# Patient Record
Sex: Male | Born: 1990 | Race: White | Hispanic: No | Marital: Single | Smoking: Current every day smoker
Health system: Southern US, Community
[De-identification: ages and names within clinical notes are randomized; demographics above are authoritative.]

---
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
## 2009-03-02 ENCOUNTER — Emergency Department (HOSPITAL_BASED_OUTPATIENT_CLINIC_OR_DEPARTMENT_OTHER): Admission: EM | Admit: 2009-03-02 | Discharge: 2009-03-03 | Payer: Self-pay | Admitting: Emergency Medicine

## 2010-12-19 ENCOUNTER — Emergency Department (HOSPITAL_BASED_OUTPATIENT_CLINIC_OR_DEPARTMENT_OTHER)
Admission: EM | Admit: 2010-12-19 | Discharge: 2010-12-19 | Disposition: A | Discharge status: Home / Self Care | Payer: Self-pay | Attending: Emergency Medicine | Admitting: Emergency Medicine

## 2010-12-19 DIAGNOSIS — F17200 Nicotine dependence, unspecified, uncomplicated: Secondary | ICD-10-CM | POA: Insufficient documentation

## 2010-12-19 DIAGNOSIS — J45909 Unspecified asthma, uncomplicated: Secondary | ICD-10-CM | POA: Insufficient documentation

## 2010-12-19 DIAGNOSIS — L0501 Pilonidal cyst with abscess: Secondary | ICD-10-CM | POA: Insufficient documentation

## 2011-01-10 ENCOUNTER — Emergency Department (HOSPITAL_BASED_OUTPATIENT_CLINIC_OR_DEPARTMENT_OTHER)
Admission: EM | Admit: 2011-01-10 | Discharge: 2011-01-10 | Disposition: A | Discharge status: Home / Self Care | Payer: Self-pay | Attending: Emergency Medicine | Admitting: Emergency Medicine

## 2011-01-10 ENCOUNTER — Emergency Department (INDEPENDENT_AMBULATORY_CARE_PROVIDER_SITE_OTHER): Payer: No Typology Code available for payment source

## 2011-01-10 DIAGNOSIS — F909 Attention-deficit hyperactivity disorder, unspecified type: Secondary | ICD-10-CM | POA: Insufficient documentation

## 2011-01-10 DIAGNOSIS — J45909 Unspecified asthma, uncomplicated: Secondary | ICD-10-CM | POA: Insufficient documentation

## 2011-01-10 DIAGNOSIS — S62329A Displaced fracture of shaft of unspecified metacarpal bone, initial encounter for closed fracture: Secondary | ICD-10-CM | POA: Insufficient documentation

## 2011-01-10 DIAGNOSIS — Y92009 Unspecified place in unspecified non-institutional (private) residence as the place of occurrence of the external cause: Secondary | ICD-10-CM | POA: Insufficient documentation

## 2011-01-10 DIAGNOSIS — W2209XA Striking against other stationary object, initial encounter: Secondary | ICD-10-CM

## 2011-01-10 DIAGNOSIS — X838XXA Intentional self-harm by other specified means, initial encounter: Secondary | ICD-10-CM | POA: Insufficient documentation

## 2011-01-28 ENCOUNTER — Encounter (HOSPITAL_COMMUNITY)
Admission: RE | Admit: 2011-01-28 | Discharge: 2011-01-28 | Disposition: A | Discharge status: Home / Self Care | Payer: Self-pay | Source: Ambulatory Visit | Attending: Orthopedic Surgery | Admitting: Orthopedic Surgery

## 2011-01-28 LAB — DIFFERENTIAL
Basophils Absolute: 0 10*3/uL (ref 0.0–0.1)
Basophils Relative: 0 % (ref 0–1)
Eosinophils Absolute: 0.4 10*3/uL (ref 0.0–0.7)
Eosinophils Relative: 4 % (ref 0–5)
Lymphocytes Relative: 30 % (ref 12–46)
Lymphs Abs: 2.7 10*3/uL (ref 0.7–4.0)
Monocytes Absolute: 0.8 10*3/uL (ref 0.1–1.0)
Monocytes Relative: 9 % (ref 3–12)
Neutro Abs: 5.2 10*3/uL (ref 1.7–7.7)
Neutrophils Relative %: 57 % (ref 43–77)

## 2011-01-28 LAB — CBC
HCT: 48.1 % (ref 39.0–52.0)
Hemoglobin: 17.8 g/dL — ABNORMAL HIGH (ref 13.0–17.0)
MCH: 31.7 pg (ref 26.0–34.0)
MCHC: 37 g/dL — ABNORMAL HIGH (ref 30.0–36.0)
MCV: 85.6 fL (ref 78.0–100.0)
Platelets: 166 10*3/uL (ref 150–400)
RBC: 5.62 MIL/uL (ref 4.22–5.81)
RDW: 12.4 % (ref 11.5–15.5)
WBC: 9.1 10*3/uL (ref 4.0–10.5)

## 2011-01-28 LAB — SURGICAL PCR SCREEN
MRSA, PCR: POSITIVE — AB
Staphylococcus aureus: POSITIVE — AB

## 2011-01-29 ENCOUNTER — Ambulatory Visit (HOSPITAL_COMMUNITY)
Admission: RE | Admit: 2011-01-29 | Discharge: 2011-01-29 | Disposition: A | Discharge status: Home / Self Care | Payer: Self-pay | Source: Ambulatory Visit | Attending: Orthopedic Surgery | Admitting: Orthopedic Surgery

## 2011-01-29 DIAGNOSIS — X838XXA Intentional self-harm by other specified means, initial encounter: Secondary | ICD-10-CM | POA: Insufficient documentation

## 2011-01-29 DIAGNOSIS — Y92009 Unspecified place in unspecified non-institutional (private) residence as the place of occurrence of the external cause: Secondary | ICD-10-CM | POA: Insufficient documentation

## 2011-01-29 DIAGNOSIS — S62309A Unspecified fracture of unspecified metacarpal bone, initial encounter for closed fracture: Secondary | ICD-10-CM | POA: Insufficient documentation

## 2011-01-29 DIAGNOSIS — Z01810 Encounter for preprocedural cardiovascular examination: Secondary | ICD-10-CM | POA: Insufficient documentation

## 2011-02-07 ENCOUNTER — Ambulatory Visit: Payer: No Typology Code available for payment source | Attending: Orthopedic Surgery | Admitting: *Deleted

## 2011-02-07 DIAGNOSIS — M25569 Pain in unspecified knee: Secondary | ICD-10-CM | POA: Insufficient documentation

## 2011-02-07 DIAGNOSIS — IMO0001 Reserved for inherently not codable concepts without codable children: Secondary | ICD-10-CM | POA: Insufficient documentation

## 2011-02-07 NOTE — Op Note (Signed)
  NAMECRANDALL, Antonio              ACCOUNT NO.:  [id number]  MEDICAL RECORD NO.:  [id number]           PATIENT TYPE:  LOCATION:                                 FACILITY:  PHYSICIAN:  Artist Pais. Elaf Clauson, M.D.DATE OF BIRTH:  04/17/1991  DATE OF PROCEDURE:  01/29/2011 DATE OF DISCHARGE:                              OPERATIVE REPORT   PREOPERATIVE DIAGNOSIS:  Displaced right small finger metacarpal fracture.  POSTOPERATIVE DIAGNOSIS:  Displaced right small finger metacarpal fracture.  PROCEDURE:  Open reduction and internal fixation of above using 1.6-mm intramedullary rod.  SURGEON:  Artist Pais. Mina Marble, MD  ASSISTANT:  None.  ANESTHESIA:  General.  TOURNIQUET TIME:  18 minutes.  COMPLICATIONS:  None.  DRAINS:  None.  The patient was taken to the operating suite.  After induction of adequate general anesthesia, right upper extremity was prepped and draped in sterile fashion.  An Esmarch was used to exsanguinate the limb.  Tourniquet was then inflated to 250 mmHg.  At this point in time, fluoroscopy was used to identify the intersection between the metaphyseal and diaphyseal flair of the small finger metacarpal on the right hand.  Once this was done, the skin was incised in that area. Dissection was carried down to the shaft of the metacarpal, and at this point in time, the 1.6-mm IM rod was introduced into the intramedullary canal of the small finger metacarpal on right side.  Using a Jahss maneuver, the metacarpal was reduced with downward pressure on the shaft and pressure on the MP joint with the PIP joint flexed to 90 degrees. Once this was done, under fluoroscopic guidance, IM rod was passed across the fracture site and then placed into stable position. Intraoperative fluoroscopy revealed adequate reduction on AP, lateral, and oblique views.  The IM rod was cut below the skin bent to 90 degrees.  The wound was irrigated and loosely closed with 4-0 Vicryl Rapide  suture.  Xeroform, 4x4s, and a compressive wrap was applied as well as an ulnar gutter splint.  The patient tolerated the procedure well and went to recovery room in stable fashion.     Artist Pais Mina Marble, M.D.     MAW/MEDQ  D:  01/29/2011  T:  01/29/2011  Job:  [id number]  Electronically Signed by Dairl Ponder M.D. on 02/07/2011 03:23:15 PM

## 2019-04-09 ENCOUNTER — Emergency Department (HOSPITAL_COMMUNITY): Payer: Self-pay

## 2019-04-09 ENCOUNTER — Other Ambulatory Visit: Payer: Self-pay

## 2019-04-09 ENCOUNTER — Encounter (HOSPITAL_COMMUNITY): Payer: Self-pay | Admitting: Emergency Medicine

## 2019-04-09 ENCOUNTER — Emergency Department (HOSPITAL_COMMUNITY)
Admission: EM | Admit: 2019-04-09 | Discharge: 2019-04-09 | Disposition: A | Discharge status: Home / Self Care | Payer: Self-pay | Attending: Emergency Medicine | Admitting: Emergency Medicine

## 2019-04-09 DIAGNOSIS — R112 Nausea with vomiting, unspecified: Secondary | ICD-10-CM | POA: Insufficient documentation

## 2019-04-09 DIAGNOSIS — R531 Weakness: Secondary | ICD-10-CM | POA: Insufficient documentation

## 2019-04-09 DIAGNOSIS — N200 Calculus of kidney: Secondary | ICD-10-CM

## 2019-04-09 DIAGNOSIS — N130 Hydronephrosis with ureteropelvic junction obstruction: Secondary | ICD-10-CM | POA: Insufficient documentation

## 2019-04-09 DIAGNOSIS — N134 Hydroureter: Secondary | ICD-10-CM | POA: Insufficient documentation

## 2019-04-09 DIAGNOSIS — F17210 Nicotine dependence, cigarettes, uncomplicated: Secondary | ICD-10-CM | POA: Insufficient documentation

## 2019-04-09 DIAGNOSIS — N50811 Right testicular pain: Secondary | ICD-10-CM | POA: Insufficient documentation

## 2019-04-09 DIAGNOSIS — R10815 Periumbilic abdominal tenderness: Secondary | ICD-10-CM | POA: Insufficient documentation

## 2019-04-09 DIAGNOSIS — N201 Calculus of ureter: Secondary | ICD-10-CM | POA: Insufficient documentation

## 2019-04-09 LAB — BASIC METABOLIC PANEL
Anion gap: 13 (ref 5–15)
BUN: 10 mg/dL (ref 6–20)
CO2: 22 mmol/L (ref 22–32)
Calcium: 9.4 mg/dL (ref 8.9–10.3)
Chloride: 103 mmol/L (ref 98–111)
Creatinine, Ser: 1.28 mg/dL — ABNORMAL HIGH (ref 0.61–1.24)
GFR calc Af Amer: >60 mL/min (ref >60)
GFR calc non Af Amer: >60 mL/min (ref >60)
Glucose, Bld: 112 mg/dL — ABNORMAL HIGH (ref 70–99)
Potassium: 3.6 mmol/L (ref 3.5–5.1)
Sodium: 138 mmol/L (ref 135–145)

## 2019-04-09 LAB — URINALYSIS, MICROSCOPIC (REFLEX)

## 2019-04-09 LAB — CBC
HCT: 50 % (ref 39.0–52.0)
Hemoglobin: 17.7 g/dL — ABNORMAL HIGH (ref 13.0–17.0)
MCH: 31.6 pg (ref 26.0–34.0)
MCHC: 35.4 g/dL (ref 30.0–36.0)
MCV: 89.1 fL (ref 80.0–100.0)
Platelets: 257 10*3/uL (ref 150–400)
RBC: 5.61 MIL/uL (ref 4.22–5.81)
RDW: 12.2 % (ref 11.5–15.5)
WBC: 14 10*3/uL — ABNORMAL HIGH (ref 4.0–10.5)
nRBC: 0 % (ref 0.0–0.2)

## 2019-04-09 LAB — URINALYSIS, ROUTINE W REFLEX MICROSCOPIC
Glucose, UA: 100 mg/dL — AB
Ketones, ur: 15 mg/dL — AB
Nitrite: NEGATIVE
Protein, ur: 100 mg/dL — AB
Specific Gravity, Urine: >1.03 — ABNORMAL HIGH (ref 1.005–1.030)
pH: 6 (ref 5.0–8.0)

## 2019-04-09 MED ORDER — KETOROLAC TROMETHAMINE 15 MG/ML IJ SOLN
15.0000 mg | Freq: Once | INTRAMUSCULAR | Status: AC
  Administered 2019-04-09: 15 mg via INTRAVENOUS
  Filled 2019-04-09: qty 1

## 2019-04-09 MED ORDER — HYDROCODONE-ACETAMINOPHEN 5-325 MG PO TABS
1.0000 | ORAL_TABLET | Freq: Four times a day (QID) | ORAL | Status: DC | PRN
  Administered 2019-04-09: 1 via ORAL
  Filled 2019-04-09: qty 1

## 2019-04-09 NOTE — ED Notes (Signed)
Pt began vomiting while in triage

## 2019-04-09 NOTE — ED Triage Notes (Signed)
Pt states he started having right flank pain and right testicle pain approx 2 hours ago. Pt states he has had some difficulty urinating as well. Pt diaphoretic stating pain 10/10. Denies testicle swelling or injury.

## 2019-04-09 NOTE — Discharge Instructions (Addendum)
You were seen in the Emergency Room today for abdominal pain caused by a kidney stone. The stone was seen on CT imaging and is small enough to be passed at home. Continue pain medication and hydration at home to facilitate passage of the stone.  Please return to the Emergency Room if you begin to have fevers/chills, vomiting, worsening abdominal pain, or are unable to urinate.

## 2019-04-09 NOTE — ED Provider Notes (Signed)
[facility] EMERGENCY DEPARTMENT Provider Note   CSN: [id number] Arrival date & time: 04/09/19  1656    History   Chief Complaint Chief Complaint  Patient presents with  . Testicle Pain  . Flank Pain    HPI Antonio Choi is a 28 y.o. male with no significant PMH who presented to the ED with acute onset flank pain that is now radiating to his testicle. The pain began about 2 hours ago and is worsening. Pt describes the pain as stabbing and occasionally burning. The pain waxes and wanes between an 8 and 10 out of 10. He states the pain is minimally relieved when he is laying down, but he can't discern what makes it worse. Pt endorses difficulty urinating, dysuria, urgency, vomiting, diaphoresis, and generalized weakness. He states his last bowel movement was last night. Pt denies ever passing a kidney stone.      Flank Pain This is a new problem. The current episode started 1 to 2 hours ago. The problem has been gradually worsening. Associated symptoms include abdominal pain. Pertinent negatives include no chest pain, no headaches and no shortness of breath. The symptoms are relieved by lying down. He has tried nothing for the symptoms.    History reviewed. No pertinent past medical history.  There are no active problems to display for this patient.   History reviewed. No pertinent surgical history.    Home Medications    Prior to Admission medications   Medication Sig Start Date End Date Taking? Authorizing Provider  acetaminophen (TYLENOL) 325 MG tablet Take 650 mg by mouth every 6 (six) hours as needed for mild pain.   Yes [provider]    Family History History reviewed. No pertinent family history.  Social History Social History   Tobacco Use  . Smoking status: Current Every Day Smoker    Types: Cigarettes  . Smokeless tobacco: Never Used  Substance Use Topics  . Alcohol use: Yes  . Drug use: Never     Allergies   Patient has  no known allergies.   Review of Systems Review of Systems  Constitutional: Positive for diaphoresis. Negative for chills and fever.  HENT: Negative for congestion, rhinorrhea and sore throat.   Respiratory: Negative for cough and shortness of breath.   Cardiovascular: Negative for chest pain and palpitations.  Gastrointestinal: Positive for abdominal pain, constipation, nausea and vomiting. Negative for diarrhea.  Genitourinary: Positive for difficulty urinating, dysuria, flank pain, frequency and testicular pain. Negative for discharge, hematuria, penile pain, penile swelling, scrotal swelling and urgency.  Neurological: Positive for weakness. Negative for dizziness, syncope, light-headedness and headaches.     Physical Exam Updated Vital Signs BP (!) 141/84   Pulse 96   Temp 97.9 F (36.6 C) (Oral)   Resp 17   Ht 5\' 5"  (1.651 m)   Wt 90.7 kg   SpO2 99%   BMI 33.28 kg/m   Physical Exam Vitals signs and nursing note reviewed.  Constitutional:      Appearance: He is well-developed. He is diaphoretic.  HENT:     Head: Normocephalic and atraumatic.     Mouth/Throat:     Mouth: Mucous membranes are moist.  Eyes:     Extraocular Movements: Extraocular movements intact.     Conjunctiva/sclera: Conjunctivae normal.  Neck:     Musculoskeletal: Neck supple.  Cardiovascular:     Rate and Rhythm: Normal rate and regular rhythm.     Heart sounds: No murmur.  Pulmonary:  Effort: Pulmonary effort is normal. No respiratory distress.     Breath sounds: Normal breath sounds.  Abdominal:     General: Bowel sounds are normal.     Palpations: Abdomen is soft.     Tenderness: There is abdominal tenderness in the right lower quadrant, periumbilical area and suprapubic area. There is right CVA tenderness and rebound. There is no left CVA tenderness or guarding. Positive signs include McBurney's sign and psoas sign.     Hernia: No hernia is present.  Genitourinary:    Penis: Normal.       Scrotum/Testes: Cremasteric reflex is present.        Right: Tenderness present. Swelling not present.        Left: Tenderness or swelling not present.  Musculoskeletal:        General: No swelling.  Skin:    General: Skin is warm.  Neurological:     General: No focal deficit present.     Mental Status: He is alert.      ED Treatments / Results  Labs (all labs ordered are listed, but only abnormal results are displayed) Labs Reviewed  URINALYSIS, ROUTINE W REFLEX MICROSCOPIC - Abnormal; Notable for the following components:      Result Value   Specific Gravity, Urine >1.030 (*)    Glucose, UA 100 (*)    Hgb urine dipstick LARGE (*)    Bilirubin Urine SMALL (*)    Ketones, ur 15 (*)    Protein, ur 100 (*)    Leukocytes,Ua TRACE (*)    All other components within normal limits  BASIC METABOLIC PANEL - Abnormal; Notable for the following components:   Glucose, Bld 112 (*)    Creatinine, Ser 1.28 (*)    All other components within normal limits  CBC - Abnormal; Notable for the following components:   WBC 14.0 (*)    Hemoglobin 17.7 (*)    All other components within normal limits  URINALYSIS, MICROSCOPIC (REFLEX) - Abnormal; Notable for the following components:   Bacteria, UA RARE (*)    All other components within normal limits    EKG None  Radiology Ct Renal Stone Study  Result Date: 04/09/2019 CLINICAL DATA:  Right flank pain. EXAM: CT ABDOMEN AND PELVIS WITHOUT CONTRAST TECHNIQUE: Multidetector CT imaging of the abdomen and pelvis was performed following the standard protocol without IV contrast. COMPARISON:  None. FINDINGS: Lower chest: The lung bases are clear. Hepatobiliary: Borderline hepatic steatosis. No focal hepatic abnormality. Gallbladder physiologically distended, no calcified stone. No biliary dilatation. Pancreas: No ductal dilatation or inflammation. Spleen: Normal in size without focal abnormality. Splenule inferiorly. Adrenals/Urinary Tract: Normal  adrenal glands. Punctate obstructing stone at the right ureterovesicular junction with mild hydroureteronephrosis, stone best appreciated on coronal reformat image 57 series 6. No definite additional nonobstructing stones in either kidney. No left hydronephrosis. Urinary bladder near completely empty. Stomach/Bowel: Stomach is within normal limits. Appendix appears normal. No evidence of bowel wall thickening, distention, or inflammatory changes. Vascular/Lymphatic: Abdominal aorta is normal in caliber. Retroaortic left renal vein. No enlarged lymph nodes in the abdomen or pelvis. Reproductive: Prostate is unremarkable. Other: No free air, free fluid, or intra-abdominal fluid collection. Musculoskeletal: There are no acute or suspicious osseous abnormalities. None fusion posterior elements of S1. IMPRESSION: Punctate obstructing stone at the right ureterovesicular junction with mild hydroureteronephrosis. Electronically Signed   By: Narda RutherfordMelanie  Sanford M.D.   On: 04/09/2019 19:35    Procedures Procedures (including critical care time)  Medications Ordered  in ED Medications  ketorolac (TORADOL) 15 MG/ML injection 15 mg (15 mg Intravenous Given 04/09/19 1811)     Initial Impression / Assessment and Plan / ED Course  I have reviewed the triage vital signs and the nursing notes.  Pertinent labs & imaging results that were available during my care of the patient were reviewed by me and considered in my medical decision making (see chart for details).    Antonio Choi is a 28 y.o. male with no significant PMH who presented to the ED with acute onset flank pain that is now radiating to his testicle concerning for nephrolithiasis. CT revealed punctate obstructing stone at the right ureterovesicular junction with mild hydroureteronephrosis. Pt had leukocytosis of 14, creatinine was slightly elevated to 1.28, and UA showed large Hgb, rare bacteria, and 6-10 WBCs. His pain was well controlled in the ED with a  one time dose of Toradol 15mg  IV. Pt is clinically stable and able to pass the stone at home with continued hydration and pain control.    Final Clinical Impressions(s) / ED Diagnoses   Final diagnoses:  Kidney stone    ED Discharge Orders    None       Thom ChimesJones, Nathifa Ritthaler, MD 04/09/19 2202    Gwyneth SproutPlunkett, Whitney, MD 04/10/19 [phone]

## 2019-04-09 NOTE — ED Notes (Signed)
Pt discharged from ED; instructions provided and scripts; Pt encouraged to return to ED if symptoms worsen and to f/u with PCP; Pt verbalized understanding of all instructions 

## 2020-08-17 ENCOUNTER — Other Ambulatory Visit: Payer: Self-pay

## 2020-08-17 DIAGNOSIS — Z20822 Contact with and (suspected) exposure to covid-19: Secondary | ICD-10-CM

## 2020-08-19 LAB — NOVEL CORONAVIRUS, NAA: SARS-CoV-2, NAA: DETECTED — AB

## 2020-08-19 LAB — SARS-COV-2, NAA 2 DAY TAT

## 2020-08-20 ENCOUNTER — Telehealth (HOSPITAL_COMMUNITY): Payer: Self-pay | Admitting: Family

## 2020-08-20 DIAGNOSIS — U071 COVID-19: Secondary | ICD-10-CM

## 2020-08-20 NOTE — Telephone Encounter (Signed)
Called to discuss with Antonio Choi about Covid symptoms and the use of casirivimab/imdevimab, a combination monoclonal antibody infusion for those with mild to moderate Covid symptoms and at a high risk of hospitalization.     Pt is qualified for this infusion at the infusion center due to co-morbid conditions and/or a member of an at-risk group, however declines infusion at this time. Symptoms tier reviewed as well as criteria for ending isolation.  Symptoms reviewed that would warrant ED/Hospital evaluation. Preventative practices reviewed. Patient verbalized understanding. Patient advised to call back if he decides that he does want to get infusion. Callback number to the infusion center given. Patient advised to go to Urgent care or ED with severe symptoms.  There are no problems to display for this patient.   Antonio Toppins,NP

## 2020-08-28 ENCOUNTER — Telehealth: Payer: Self-pay

## 2020-08-28 NOTE — Telephone Encounter (Signed)
Pt. Calling to verify positive COVID 19 test. States employer told him it was negative, but it shows a positive result. Verbalizes understanding.

## 2021-02-10 IMAGING — CT CT RENAL STONE PROTOCOL
2 of 4 series · 17 of 46 positions shown, 19 images · non-contrast
Comparison: None.

CLINICAL DATA: Right flank pain.

EXAM:
CT ABDOMEN AND PELVIS WITHOUT CONTRAST
TECHNIQUE: Multidetector CT imaging of the abdomen and pelvis was performed
following the standard protocol without IV contrast.

[Series 3: stone study 5.0 i30f 2 · axial · 0.82mm/px · z∈[+629,+1064]mm · 14 of 97 slices shown, 16 images]
[im 5/97  soft-tissue]
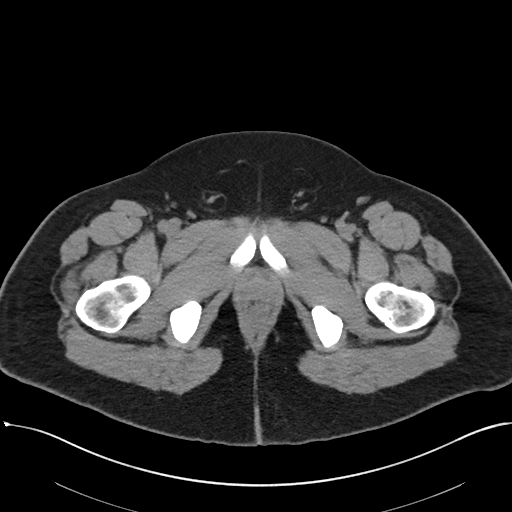
[im 5/97  bone]
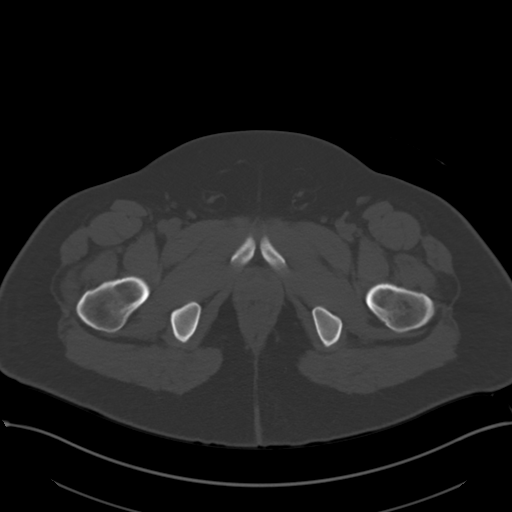
[im 14/97  soft-tissue]
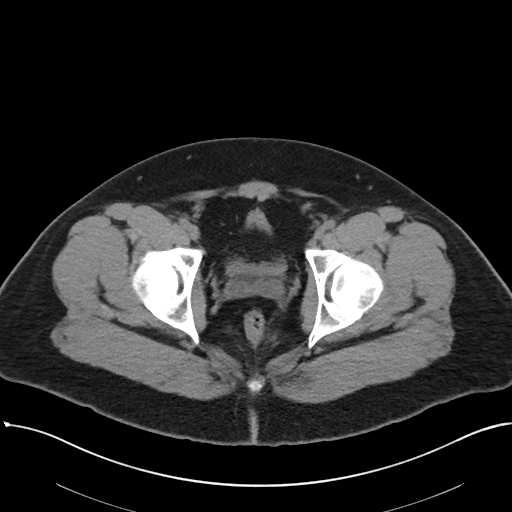
[im 18/97  soft-tissue]
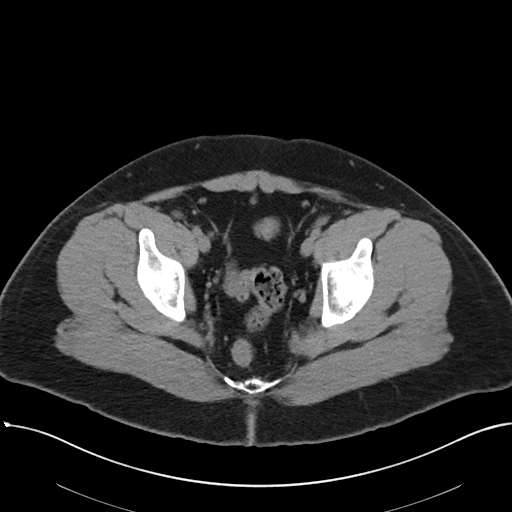
[im 27/97  soft-tissue]
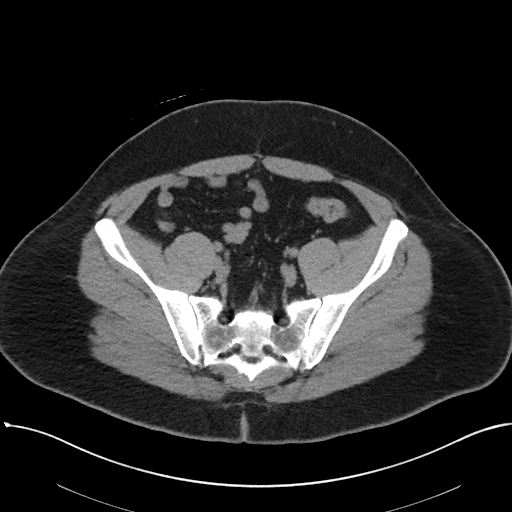
[im 31/97  soft-tissue]
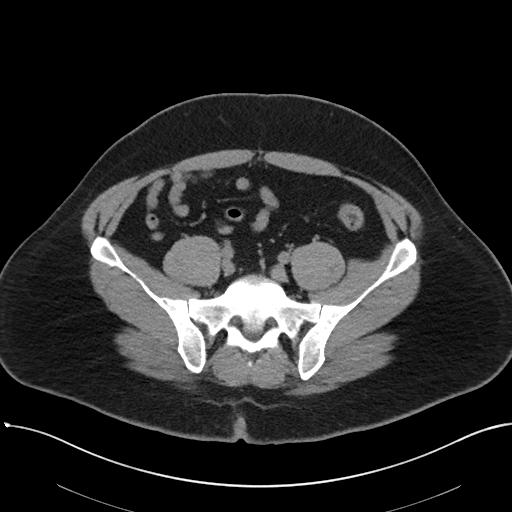
[im 40/97  soft-tissue]
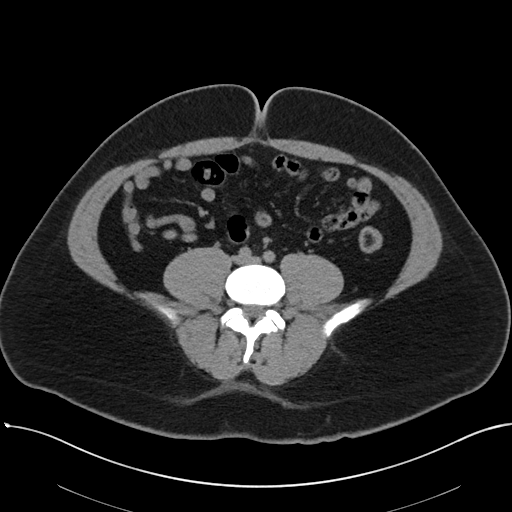
[im 44/97  soft-tissue]
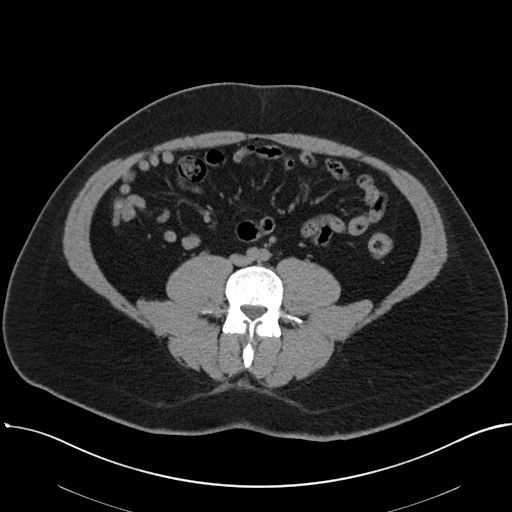
[im 53/97  soft-tissue]
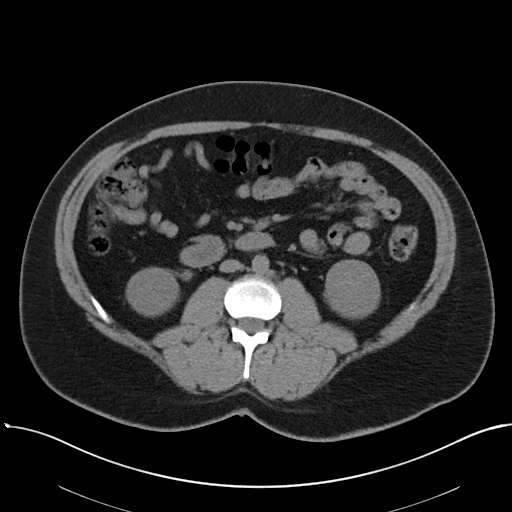
[im 57/97  soft-tissue]
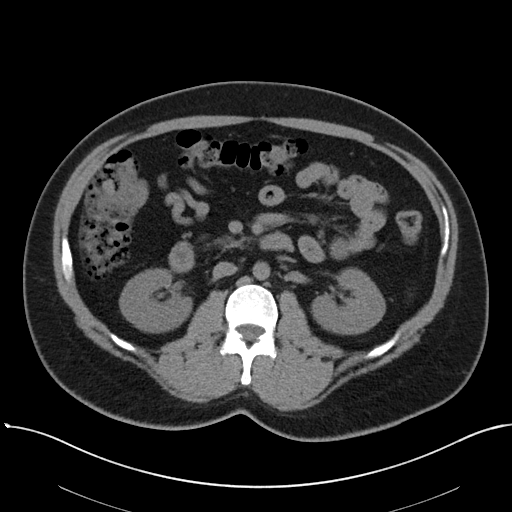
[im 57/97  bone]
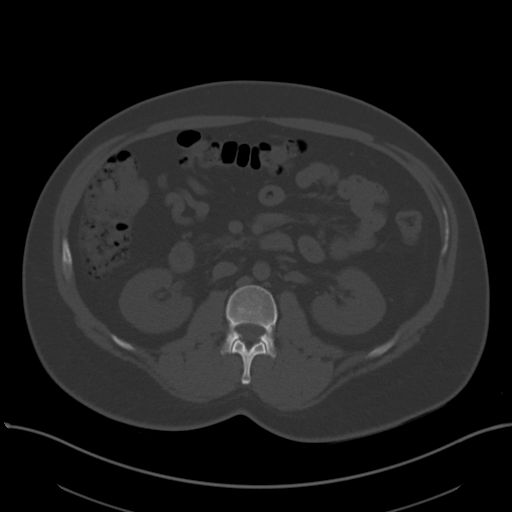
[im 66/97  soft-tissue]
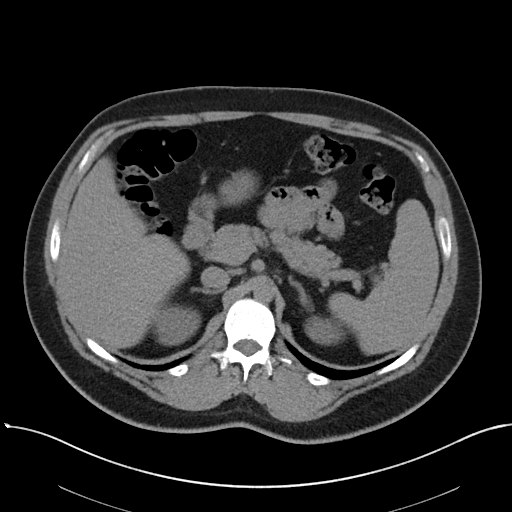
[im 70/97  soft-tissue]
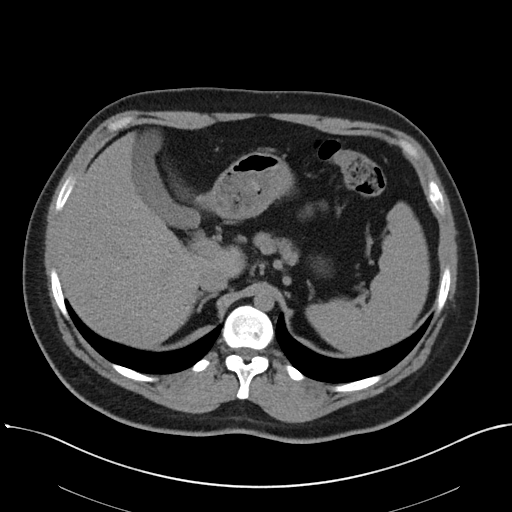
[im 79/97  soft-tissue]
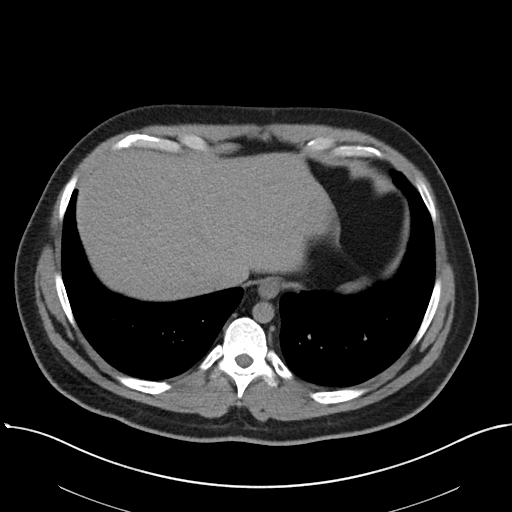
[im 83/97  soft-tissue]
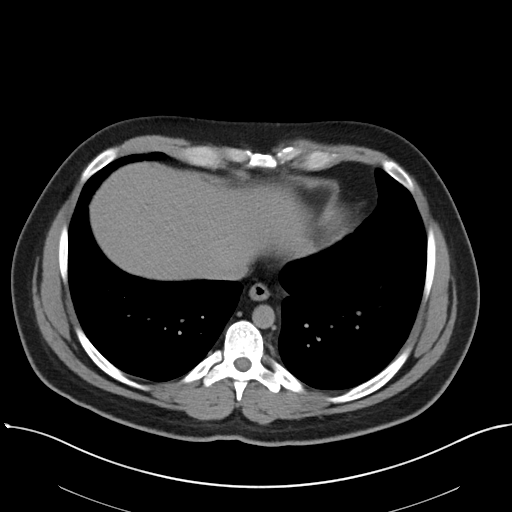
[im 92/97  soft-tissue]
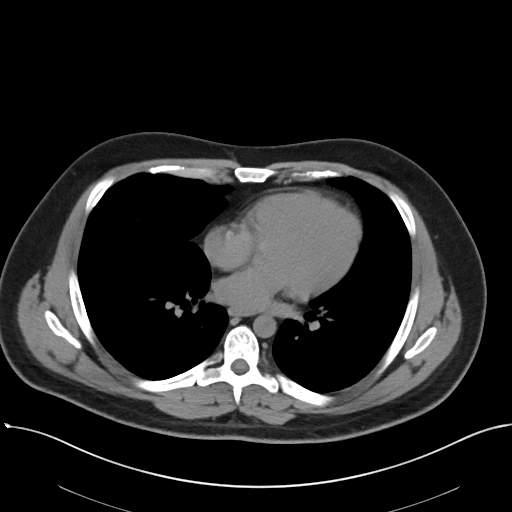

[Series 6: coronal soft tissue · coronal · 0.91mm/px · 3 of 101 slices shown]
[im 34/101  soft-tissue]
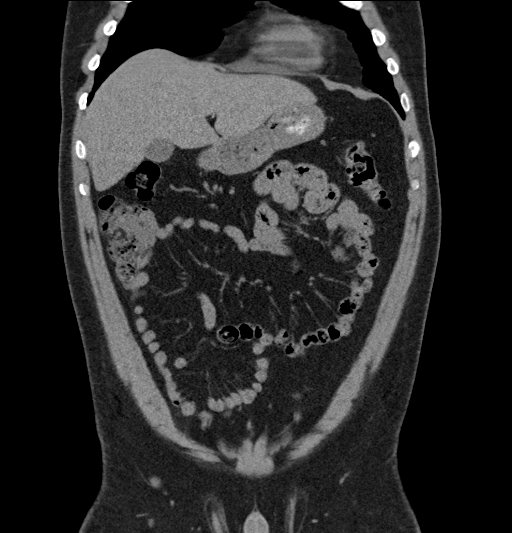
[im 45/101  soft-tissue]
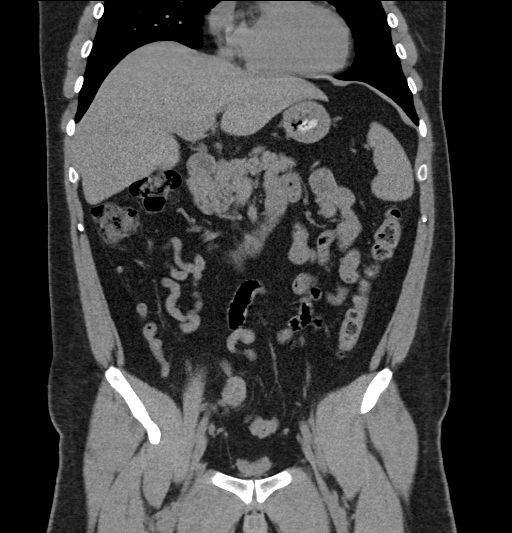
[im 56/101  soft-tissue]
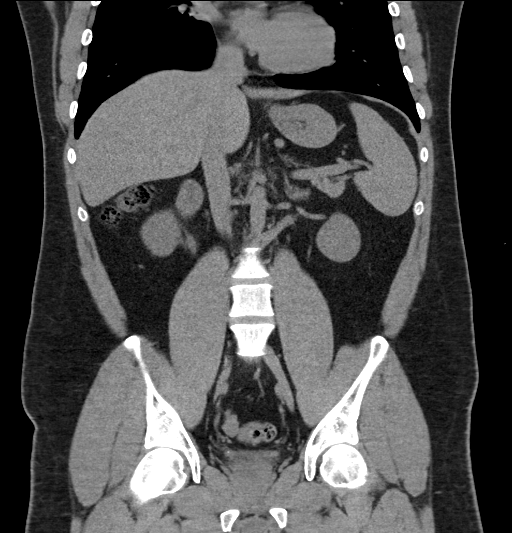

[17 of 46 positions shown; findings below may reference images not displayed]

FINDINGS: Lower chest: The lung bases are clear.

Hepatobiliary: Borderline hepatic steatosis. No focal hepatic
abnormality. Gallbladder physiologically distended, no calcified
stone. No biliary dilatation.

Pancreas: No ductal dilatation or inflammation.

Spleen: Normal in size without focal abnormality. Splenule
inferiorly.

Adrenals/Urinary Tract: Normal adrenal glands. Punctate obstructing
stone at the right ureterovesicular junction with mild
hydroureteronephrosis, stone best appreciated on coronal reformat
image 57 series 6. No definite additional nonobstructing stones in
either kidney. No left hydronephrosis. Urinary bladder near
completely empty.

Stomach/Bowel: Stomach is within normal limits. Appendix appears
normal. No evidence of bowel wall thickening, distention, or
inflammatory changes.

Vascular/Lymphatic: Abdominal aorta is normal in caliber.
Retroaortic left renal vein. No enlarged lymph nodes in the abdomen
or pelvis.

Reproductive: Prostate is unremarkable.

Other: No free air, free fluid, or intra-abdominal fluid collection.

Musculoskeletal: There are no acute or suspicious osseous
abnormalities. None fusion posterior elements of S1.
IMPRESSION: Punctate obstructing stone at the right ureterovesicular junction
with mild hydroureteronephrosis.

## 2021-04-26 ENCOUNTER — Other Ambulatory Visit: Payer: Self-pay

## 2021-04-26 DIAGNOSIS — L0591 Pilonidal cyst without abscess: Secondary | ICD-10-CM | POA: Insufficient documentation

## 2021-04-26 DIAGNOSIS — F17210 Nicotine dependence, cigarettes, uncomplicated: Secondary | ICD-10-CM | POA: Insufficient documentation

## 2021-04-26 NOTE — ED Triage Notes (Addendum)
Pt states he has had a pilonidal cyst that he has had for the past few days, pt states pain is worse today and he is nervous that it has turned into an abscess, pt states he has had abscesses in the past. Pt denies any drainage at this time

## 2021-04-27 ENCOUNTER — Emergency Department
Admission: EM | Admit: 2021-04-27 | Discharge: 2021-04-27 | Disposition: A | Discharge status: Home / Self Care | Payer: BC Managed Care – PPO | Attending: Emergency Medicine | Admitting: Emergency Medicine

## 2021-04-27 DIAGNOSIS — L0591 Pilonidal cyst without abscess: Secondary | ICD-10-CM

## 2021-04-27 MED ORDER — CLINDAMYCIN HCL 300 MG PO CAPS: 300.0000 mg | ORAL_CAPSULE | Freq: Three times a day (TID) | ORAL | 0 refills | Status: AC

## 2021-04-27 MED ORDER — LIDOCAINE HCL (PF) 1 % IJ SOLN
5.0000 mL | Freq: Once | INTRAMUSCULAR | Status: AC
  Administered 2021-04-27: 5 mL
  Filled 2021-04-27: qty 5

## 2021-04-27 NOTE — ED Notes (Signed)
E-signature pad unavailable - Pt verbalized understanding of D/C information - no additional concerns at this time.  

## 2021-04-27 NOTE — ED Provider Notes (Signed)
[facility] Emergency Department Provider Note  Time seen: 5:05 AM  I have reviewed the triage vital signs and the nursing notes.   HISTORY  Chief Complaint Cyst   HPI Antonio Choi is a 30 y.o. male with a past medical history of a pilonidal cyst who presents to the emergency department for possible abscess.  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  States for the past few days he has had worsening pain to the area and was concerned that he could have had an abscess develop.  Patient denies any fever.  No vomiting.   History reviewed. No pertinent past medical history.  There are no problems to display for this patient.   History reviewed. No pertinent surgical history.  Prior to Admission medications   Medication Sig Start Date End Date Taking? Authorizing Provider  acetaminophen (TYLENOL) 325 MG tablet Take 650 mg by mouth every 6 (six) hours as needed for mild pain.    [provider]    No Known Allergies  No family history on file.  Social History Social History   Tobacco Use   Smoking status: Every Day    Types: Cigarettes   Smokeless tobacco: Never  Substance Use Topics   Alcohol use: Yes   Drug use: Never    Review of Systems Constitutional: Negative for fever. Cardiovascular: Negative for chest pain. Respiratory: Negative for shortness of breath. Gastrointestinal: Negative for abdominal pain.  Pain to the area of his pilonidal cyst. Musculoskeletal: Negative for musculoskeletal complaints Skin: Pain to the area of his past pilonidal cyst. Neurological: Negative for headache All other ROS negative  ____________________________________________   PHYSICAL EXAM:  VITAL SIGNS: ED Triage Vitals  Enc Vitals Group     BP 04/26/21 2209 (!) 147/101     Pulse Rate 04/26/21 2209 97     Resp 04/26/21 2209 16     Temp 04/26/21 2209 98.6  F (37 C)     Temp Source 04/26/21 2209 Oral     SpO2 04/26/21 2209 98 %     Weight 04/26/21 2208 186 lb (84.4 kg)     Height 04/26/21 2208 5\' 5"  (1.651 m)     Head Circumference --      Peak Flow --      Pain Score 04/26/21 2207 3     Pain Loc --      Pain Edu? --      Excl. in GC? --     Constitutional: Alert and oriented. Well appearing and in no distress. Eyes: Normal exam ENT      Head: Normocephalic and atraumatic.      Mouth/Throat: Mucous membranes are moist. Cardiovascular: Normal rate, regular rhythm.  Respiratory: Normal respiratory effort without tachypnea nor retractions. Breath sounds are clear  Gastrointestinal: Soft and nontender. No distention. Musculoskeletal: Nontender with normal range of motion in all extremities.  Neurologic:  Normal speech and language. No gross focal neurologic deficits  Skin:  Skin is warm, dry.  There is an area of fluctuance in the area of his past pilonidal cyst.  Mild tenderness to this area as well.  With a mild amount of surrounding induration. Psychiatric: Mood and affect are normal.   ____________________________________________   INITIAL IMPRESSION / ASSESSMENT AND PLAN / ED COURSE  Pertinent labs & imaging results that were available during my care of the patient were reviewed by me and  considered in my medical decision making (see chart for details).   Patient presents emergency department for pain to the area of his past pilonidal cyst concerned that he could have a developing abscess once again.  Patient states he has had this area lanced several times in the past.  We will attempt to incise and drain.  We will place patient on antibiotics and have him follow-up with surgery.  Patient agreeable to plan of care.  INCISION AND DRAINAGE Performed by: Minna Antis Consent: Verbal consent obtained. Risks and benefits: risks, benefits and alternatives were discussed Type: abscess  Body area: gluteal cleft/pilonidal  cyst  Anesthesia: local infiltration  Incision was made with a scalpel.  Local anesthetic: lidocaine 1% without epinephrine  Anesthetic total: 2 ml  Complexity: complex Blunt dissection to break up loculations  Drainage: purulent  Drainage amount: 5 cc  Packing material: 1/4 in iodoform gauze  Patient tolerance: Patient tolerated the procedure well with no immediate complications.    Antonio Choi was evaluated in Emergency Department on 04/27/2021 for the symptoms described in the history of present illness. He was evaluated in the context of the global COVID-19 pandemic, which necessitated consideration that the patient might be at risk for infection with the SARS-CoV-2 virus that causes COVID-19. Institutional protocols and algorithms that pertain to the evaluation of patients at risk for COVID-19 are in a state of rapid change based on information released by regulatory bodies including the CDC and federal and state organizations. These policies and algorithms were followed during the patient's care in the ED.  ____________________________________________   FINAL CLINICAL IMPRESSION(S) / ED DIAGNOSES  Pilonidal cyst   Minna Antis, MD 04/27/21 [phone]

## 2022-06-29 ENCOUNTER — Encounter: Payer: Self-pay | Admitting: Emergency Medicine

## 2022-06-29 ENCOUNTER — Emergency Department: Payer: PRIVATE HEALTH INSURANCE

## 2022-06-29 ENCOUNTER — Emergency Department
Admission: EM | Admit: 2022-06-29 | Discharge: 2022-06-29 | Disposition: A | Discharge status: Home / Self Care | Payer: PRIVATE HEALTH INSURANCE | Attending: Emergency Medicine | Admitting: Emergency Medicine

## 2022-06-29 DIAGNOSIS — S0003XA Contusion of scalp, initial encounter: Secondary | ICD-10-CM

## 2022-06-29 DIAGNOSIS — T148XXA Other injury of unspecified body region, initial encounter: Secondary | ICD-10-CM

## 2022-06-29 DIAGNOSIS — W11XXXA Fall on and from ladder, initial encounter: Secondary | ICD-10-CM | POA: Insufficient documentation

## 2022-06-29 DIAGNOSIS — Y990 Civilian activity done for income or pay: Secondary | ICD-10-CM | POA: Diagnosis not present

## 2022-06-29 DIAGNOSIS — S0990XA Unspecified injury of head, initial encounter: Secondary | ICD-10-CM | POA: Diagnosis present

## 2022-06-29 NOTE — ED Provider Notes (Signed)
[facility] Provider Note    Event Date/Time   First MD Initiated Contact with Patient 06/29/22 [phone]     (approximate)   History   Head Injury   HPI  Antonio Choi is a 31 y.o. male who presents to the ED from work with a chief complaint of head injury.  Patient was working at [employer] when a Dance movement psychotherapist which was unsecured fell and struck him in the forehead and top of head.  Patient was dazed but did not suffer LOC.  Denies vision changes, headache, neck pain, nausea, vomiting or dizziness.  Denies anticoagulant use.     Past Medical History  History reviewed. No pertinent past medical history.   Active Problem List  There are no problems to display for this patient.    Past Surgical History  History reviewed. No pertinent surgical history.   Home Medications   Prior to Admission medications   Medication Sig Start Date End Date Taking? Authorizing Provider  acetaminophen (TYLENOL) 325 MG tablet Take 650 mg by mouth every 6 (six) hours as needed for mild pain.    [provider]     Allergies  Patient has no known allergies.   Family History  History reviewed. No pertinent family history.   Physical Exam  Triage Vital Signs: ED Triage Vitals  Enc Vitals Group     BP 06/29/22 0014 (!) 156/91     Pulse Rate 06/29/22 0014 69     Resp 06/29/22 0014 20     Temp 06/29/22 0014 98.6 F (37 C)     Temp Source 06/29/22 0014 Oral     SpO2 06/29/22 0014 97 %     Weight 06/29/22 0015 190 lb (86.2 kg)     Height 06/29/22 0015 5\' 5"  (1.651 m)     Head Circumference --      Peak Flow --      Pain Score --      Pain Loc --      Pain Edu? --      Excl. in [place]? --     Updated Vital Signs: BP (!) 156/91 (BP Location: Left Arm)   Pulse 69   Temp 98.6 F (37 C) (Oral)   Resp 20   Ht 5\' 5"  (1.651 m)   Wt 86.2 kg   SpO2 97%   BMI 31.62 kg/m    General: Awake, no distress.  PERRL.  EOMI. CV:  Good peripheral  perfusion.  Resp:  Normal effort.  Abd:  No distention.  Other:  Alert and oriented x3.  CN II-XII grossly intact.  5/5 motor strength and sensation all extremities.  MAE x4.  Small right forehead hematoma with central abrasion without active bleeding.  Small contusion to vertex of head.   ED Results / Procedures / Treatments  Labs (all labs ordered are listed, but only abnormal results are displayed) Labs Reviewed - No data to display   EKG  None   RADIOLOGY I have independently visualized and interpreted patient's CT scan as well as noted the radiology interpretation:  CT head: No ICH  Official radiology report(s): CT HEAD WO CONTRAST (5MM)  Result Date: 06/29/2022 CLINICAL DATA:  Head injury EXAM: CT HEAD WITHOUT CONTRAST TECHNIQUE: Contiguous axial images were obtained from the base of the skull through the vertex without intravenous contrast. RADIATION DOSE REDUCTION: This exam was performed according to the departmental dose-optimization program which includes automated exposure control, adjustment of the mA  and/or kV according to patient size and/or use of iterative reconstruction technique. COMPARISON:  None Available. FINDINGS: Brain: No evidence of acute infarction, hemorrhage, hydrocephalus, extra-axial collection or mass lesion/mass effect. Vascular: No hyperdense vessel or unexpected calcification. Skull: Normal. Negative for fracture or focal lesion. Sinuses/Orbits: Opacification of the left maxillary sinus. Visualized paranasal sinuses and mastoid air cells are otherwise clear. Other: None. IMPRESSION: Normal head CT. Electronically Signed   By: Julian Hy M.D.   On: 06/29/2022 00:55     PROCEDURES:  Critical Care performed: No  Procedures   MEDICATIONS ORDERED IN ED: Medications - No data to display   IMPRESSION / MDM / [place] / ED COURSE  I reviewed the triage vital signs and the nursing notes.                             31 year old  male presenting with minor head injury.  There are no focal neurological deficits on examination.  CT is negative for intracranial hemorrhage.  Patient is safe for discharge home and is safe for returning to work.  Strict return precautions given.  Patient verbalizes understanding agrees with plan of care.  Patient's presentation is most consistent with acute, uncomplicated illness.  FINAL CLINICAL IMPRESSION(S) / ED DIAGNOSES   Final diagnoses:  Minor head injury, initial encounter  Contusion of scalp, initial encounter     Rx / DC Orders   ED Discharge Orders     None        Note:  This document was prepared using Dragon voice recognition software and may include unintentional dictation errors.   Paulette Blanch, MD 06/29/22 [phone]

## 2022-06-29 NOTE — ED Triage Notes (Signed)
Pt presents via POV with complaints of a head injury. Pt was at work at [employer] and a metal ladder fell and hit him in the head. Pt wishes to complete a W/C but doesn't have any paperwork. Pt has small abrasion on the front right side of his head and a "goose egg" on the posterior aspect of his head. Denies LOC, no thinners, no neck tenderness.

## 2022-06-29 NOTE — ED Notes (Signed)
E-signature pad unavailable - Pt verbalized understanding of D/C information - no additional concerns at this time.  

## 2022-06-29 NOTE — Discharge Instructions (Signed)
Apply ice to affected area several times daily to reduce swelling.  Return to the ER for worsening symptoms, persistent vomiting, lethargy or other concerns. 

## 2023-12-31 ENCOUNTER — Other Ambulatory Visit: Payer: Self-pay

## 2023-12-31 ENCOUNTER — Emergency Department
Admission: EM | Admit: 2023-12-31 | Discharge: 2023-12-31 | Disposition: A | Discharge status: Home / Self Care | Attending: Emergency Medicine | Admitting: Emergency Medicine

## 2023-12-31 DIAGNOSIS — K0889 Other specified disorders of teeth and supporting structures: Secondary | ICD-10-CM | POA: Diagnosis present

## 2023-12-31 DIAGNOSIS — K047 Periapical abscess without sinus: Secondary | ICD-10-CM | POA: Diagnosis not present

## 2023-12-31 MED ORDER — AMOXICILLIN 500 MG PO CAPS
500.0000 mg | ORAL_CAPSULE | Freq: Once | ORAL | Status: AC
  Administered 2023-12-31: 500 mg via ORAL
  Filled 2023-12-31: qty 1

## 2023-12-31 MED ORDER — AMOXICILLIN 500 MG PO CAPS: 500.0000 mg | ORAL_CAPSULE | Freq: Three times a day (TID) | ORAL | 0 refills | Status: AC

## 2023-12-31 NOTE — Discharge Instructions (Signed)
 OPTIONS FOR DENTAL FOLLOW UP CARE  [employer] Health and Human Services - Local Safety Net Dental Clinics [email or web address]   [facility] [phone])  Antonio Choi [phone])  [place] [phone] ext [phone])  [facility] [phone])  [facility] [phone]) This clinic caters to the indigent population and is on a lottery system. Location: [employer] [facility] [facility] of Dentistry, [employer], [address], [place] Clinic Hours: Wednesdays from 6pm - 9pm, patients seen by a lottery system. For dates, call or go to [email or web address] Services: Cleanings, fillings and simple extractions. Payment Options: DENTAL WORK IS FREE OF CHARGE. Bring proof of income or support. Best way to get seen: Arrive at 5:15 pm - this is a lottery, NOT first come/first serve, so arriving earlier will not increase your chances of being seen.     [facility] [phone] Select option 1 for emergencies   Location: [facility] of Dentistry, [place], [address], [place] Clinic Hours: No walk-ins accepted - call the day before to schedule an appointment. Check in times are 9:30 am and 1:30 pm. Services: Simple extractions, temporary fillings, pulpectomy/pulp debridement, uncomplicated abscess drainage. Payment Options: PAYMENT IS DUE AT THE TIME OF SERVICE.  Fee is usually $100-200, additional surgical procedures (e.g. abscess drainage) may be extra. Cash, checks, Visa/MasterCard accepted.  Can file Medicaid if patient is covered for dental - patient should call case worker to check. No discount for [facility] patients. Best way to get seen: MUST call the day before and get onto the schedule. Can usually be seen the next 1-2 days. No walk-ins accepted.     [facility] [phone]    Location: [facility], [address], [place] Clinic Hours: M, W, Th, F 8am or 1:30pm, Tues 9a or 1:30 - first come/first served. Services: Simple extractions, temporary fillings, uncomplicated abscess drainage.  You do not need to be an [facility] resident. Payment Options: PAYMENT IS DUE AT THE TIME OF SERVICE. Dental insurance, otherwise sliding scale - bring proof of income or support. Depending on income and treatment needed, cost is usually $50-200. Best way to get seen: Arrive early as it is first come/first served.     [facility] [phone]   Location: [address] Clinic Hours: Mon-Thu 8a-5p Services: Most basic dental services including extractions and fillings. Payment Options: PAYMENT IS DUE AT THE TIME OF SERVICE. Sliding scale, up to 50% off - bring proof if income or support. Medicaid with dental option accepted. Best way to get seen: Call to schedule an appointment, can usually be seen within 2 weeks OR they will try to see walk-ins - show up at 8a or 2p (you may have to wait).     [facility] [phone] [place] RESIDENTS ONLY   Location: [facility], [address], [place], [place] [address] Clinic Hours: By appointment only. Monday - Thursday 8am-5pm, Friday 8am-12pm Services: Cleanings, fillings, extractions. Payment Options: PAYMENT IS DUE AT THE TIME OF SERVICE. Cash, Visa or MasterCard. Sliding scale - $30 minimum per service. Best way to get seen: Come in to office, complete packet and make an appointment - need proof of income or support monies for each household member and proof of [facility] residence. Usually takes about a month to get in.     [facility] [phone]   Location: [address]  [place] Clinic Hours: Walk-in Urgent Care Dental Services are offered Monday-Friday  mornings only. The numbers of emergencies accepted daily is limited to the number of providers available. Maximum 15 - Mondays, Wednesdays & Thursdays Maximum 10 - Tuesdays & Fridays Services: You do not need to be a [facility] resident to be seen for a dental emergency. Emergencies are defined as pain, swelling, abnormal bleeding, or dental trauma. Walkins will receive x-rays if needed. NOTE: Dental cleaning is not an emergency. Payment Options: PAYMENT IS DUE AT THE TIME OF SERVICE. Minimum co-pay is $40.00 for uninsured patients. Minimum co-pay is $3.00 for Medicaid with dental coverage. Dental Insurance is accepted and must be presented at time of visit. Medicare does not cover dental. Forms of payment: Cash, credit card, checks. Best way to get seen: If not previously registered with the clinic, walk-in dental registration begins at 7:15 am and is on a first come/first serve basis. If previously registered with the clinic, call to make an appointment.     [facility] [phone] [place] RESIDENTS ONLY   Location: [address] [place], [place] Clinic Hours: Mon-Thu 10a-2p Services: Extractions only! Payment Options: FREE (donations accepted) - bring proof of income or support Best way to get seen: Call and schedule an appointment OR come at 8am on the 1st Monday of every month (except for holidays) when it is first come/first served.     [facility] [phone]   Location: [address] [place], [place] Clinic Hours: Friday mornings Services, Payment Options, Best way to get seen: Call for info

## 2023-12-31 NOTE — ED Notes (Signed)
 Written and verbal discharge instructions reviewed with patient, understanding verbalized, denied questions. Discharged from unit ambulatory in good condition alone.

## 2023-12-31 NOTE — ED Triage Notes (Signed)
 Pt to ED via POV c/o toothache to left lower molar. Has been going on for a couple days. Went away earlier today but has noticed some swelling now. Denies fevers

## 2023-12-31 NOTE — ED Provider Notes (Signed)
 [facility] Provider Note    Event Date/Time   First MD Initiated Contact with Patient 12/31/23 1941     (approximate)   History   Dental Pain   HPI  Antonio Choi is a 33 y.o. male with no significant past medical history presents emergency department dental pain.  Patient states been going on for a while.  Knows he has bad teeth.  Was very surprised that no dentist office was open on the weekend.  Therefore he came to the emergency department for us  to tell him what is wrong with the teeth      Physical Exam   Triage Vital Signs: ED Triage Vitals  Encounter Vitals Group     BP 12/31/23 1932 (!) 155/91     Systolic BP Percentile --      Diastolic BP Percentile --      Pulse Rate 12/31/23 1932 81     Resp 12/31/23 1932 16     Temp 12/31/23 1932 98.2 F (36.8 C)     Temp src --      SpO2 12/31/23 1932 95 %     Weight --      Height --      Head Circumference --      Peak Flow --      Pain Score 12/31/23 1930 3     Pain Loc --      Pain Education --      Exclude from Growth Chart --     Most recent vital signs: Vitals:   12/31/23 1932  BP: (!) 155/91  Pulse: 81  Resp: 16  Temp: 98.2 F (36.8 C)  SpO2: 95%     General: Awake, no distress.   CV:  Good peripheral perfusion. regular rate and  rhythm Resp:  Normal effort. Abd:  No distention.   Other:  Poor dentition noted, swelling noted to the left lower molar area, neck is supple, no lymphadenopathy   ED Results / Procedures / Treatments   Labs (all labs ordered are listed, but only abnormal results are displayed) Labs Reviewed - No data to display   EKG     RADIOLOGY     PROCEDURES:   Procedures Chief Complaint  Patient presents with   Dental Pain      MEDICATIONS ORDERED IN ED: Medications  amoxicillin (AMOXIL) capsule 500 mg (has no administration in time range)     IMPRESSION / MDM / ASSESSMENT AND PLAN / ED COURSE  I reviewed the triage  vital signs and the nursing notes.                              Differential diagnosis includes, but is not limited to, dental pain, dental abscess, gingivitis  Patient's presentation is most consistent with acute, uncomplicated illness.   The patient most likely has a dental abscess.  He does have a lot of caries noted in the molars and other teeth.  Encouraged him to follow-up with a dental clinic.  Given a prescription for amoxicillin.  Take over-the-counter medication for pain.  He is in agreement treatment plan.  Discharged stable condition.      FINAL CLINICAL IMPRESSION(S) / ED DIAGNOSES   Final diagnoses:  Dental abscess     Rx / DC Orders   ED Discharge Orders          Ordered    amoxicillin (AMOXIL) 500 MG capsule  3 times daily        12/31/23 1947             Note:  This document was prepared using Dragon voice recognition software and may include unintentional dictation errors.    Delsie Figures, PA-C 12/31/23 Mertie Abt    Bryson Carbine, MD 01/04/24 [phone]
